# Patient Record
Sex: Female | Born: 1935 | Race: White | Hispanic: No | Marital: Married | Smoking: Never smoker
Health system: Southern US, Community
[De-identification: ages and names within clinical notes are randomized; demographics above are authoritative.]

## PROBLEM LIST (undated history)

## (undated) DIAGNOSIS — C801 Malignant (primary) neoplasm, unspecified: Secondary | ICD-10-CM

## (undated) DIAGNOSIS — I10 Essential (primary) hypertension: Secondary | ICD-10-CM

## (undated) DIAGNOSIS — J45909 Unspecified asthma, uncomplicated: Secondary | ICD-10-CM

## (undated) HISTORY — PX: MASTECTOMY: SHX3

## (undated) HISTORY — DX: Malignant (primary) neoplasm, unspecified: C80.1

## (undated) HISTORY — PX: APPENDECTOMY: SHX54

---

## 1998-05-06 ENCOUNTER — Other Ambulatory Visit: Admission: RE | Admit: 1998-05-06 | Discharge: 1998-05-06 | Payer: Self-pay | Admitting: Obstetrics and Gynecology

## 1998-11-02 ENCOUNTER — Ambulatory Visit (HOSPITAL_COMMUNITY): Admission: RE | Admit: 1998-11-02 | Discharge: 1998-11-02 | Payer: Self-pay | Admitting: *Deleted

## 1999-04-12 ENCOUNTER — Other Ambulatory Visit: Admission: RE | Admit: 1999-04-12 | Discharge: 1999-04-12 | Payer: Self-pay | Admitting: Obstetrics and Gynecology

## 1999-04-12 ENCOUNTER — Encounter (INDEPENDENT_AMBULATORY_CARE_PROVIDER_SITE_OTHER): Payer: Self-pay | Admitting: Specialist

## 1999-05-12 ENCOUNTER — Other Ambulatory Visit: Admission: RE | Admit: 1999-05-12 | Discharge: 1999-05-12 | Payer: Self-pay | Admitting: Obstetrics and Gynecology

## 1999-06-17 ENCOUNTER — Ambulatory Visit (HOSPITAL_COMMUNITY): Admission: RE | Admit: 1999-06-17 | Discharge: 1999-06-17 | Payer: Self-pay | Admitting: *Deleted

## 1999-07-02 ENCOUNTER — Ambulatory Visit (HOSPITAL_COMMUNITY): Admission: RE | Admit: 1999-07-02 | Discharge: 1999-07-02 | Payer: Self-pay | Admitting: Obstetrics and Gynecology

## 1999-07-02 ENCOUNTER — Encounter (INDEPENDENT_AMBULATORY_CARE_PROVIDER_SITE_OTHER): Payer: Self-pay

## 1999-10-29 ENCOUNTER — Ambulatory Visit (HOSPITAL_COMMUNITY): Admission: RE | Admit: 1999-10-29 | Discharge: 1999-10-29 | Payer: Self-pay | Admitting: *Deleted

## 1999-11-24 ENCOUNTER — Ambulatory Visit (HOSPITAL_COMMUNITY): Admission: RE | Admit: 1999-11-24 | Discharge: 1999-11-24 | Payer: Self-pay | Admitting: *Deleted

## 1999-12-29 ENCOUNTER — Ambulatory Visit (HOSPITAL_COMMUNITY): Admission: RE | Admit: 1999-12-29 | Discharge: 1999-12-29 | Payer: Self-pay | Admitting: *Deleted

## 2000-05-09 ENCOUNTER — Other Ambulatory Visit: Admission: RE | Admit: 2000-05-09 | Discharge: 2000-05-09 | Payer: Self-pay | Admitting: Obstetrics and Gynecology

## 2000-11-02 ENCOUNTER — Encounter: Payer: Self-pay | Admitting: Internal Medicine

## 2000-11-02 ENCOUNTER — Ambulatory Visit (HOSPITAL_COMMUNITY): Admission: RE | Admit: 2000-11-02 | Discharge: 2000-11-02 | Payer: Self-pay | Admitting: Internal Medicine

## 2000-11-20 ENCOUNTER — Ambulatory Visit (HOSPITAL_COMMUNITY): Admission: RE | Admit: 2000-11-20 | Discharge: 2000-11-20 | Payer: Self-pay | Admitting: Gastroenterology

## 2000-11-20 ENCOUNTER — Encounter (INDEPENDENT_AMBULATORY_CARE_PROVIDER_SITE_OTHER): Payer: Self-pay | Admitting: Specialist

## 2001-05-10 ENCOUNTER — Other Ambulatory Visit: Admission: RE | Admit: 2001-05-10 | Discharge: 2001-05-10 | Payer: Self-pay | Admitting: Obstetrics and Gynecology

## 2001-10-31 ENCOUNTER — Ambulatory Visit (HOSPITAL_COMMUNITY): Admission: RE | Admit: 2001-10-31 | Discharge: 2001-10-31 | Payer: Self-pay | Admitting: Internal Medicine

## 2001-10-31 ENCOUNTER — Encounter: Payer: Self-pay | Admitting: Internal Medicine

## 2002-05-13 ENCOUNTER — Other Ambulatory Visit: Admission: RE | Admit: 2002-05-13 | Discharge: 2002-05-13 | Payer: Self-pay | Admitting: Obstetrics and Gynecology

## 2002-06-27 ENCOUNTER — Encounter: Payer: Self-pay | Admitting: Emergency Medicine

## 2002-06-27 ENCOUNTER — Inpatient Hospital Stay (HOSPITAL_COMMUNITY): Admission: EM | Admit: 2002-06-27 | Discharge: 2002-06-29 | Payer: Self-pay | Admitting: Emergency Medicine

## 2002-06-27 ENCOUNTER — Encounter: Payer: Self-pay | Admitting: Internal Medicine

## 2002-06-28 ENCOUNTER — Encounter: Payer: Self-pay | Admitting: Internal Medicine

## 2002-12-09 ENCOUNTER — Ambulatory Visit (HOSPITAL_COMMUNITY): Admission: RE | Admit: 2002-12-09 | Discharge: 2002-12-09 | Payer: Self-pay | Admitting: Internal Medicine

## 2002-12-09 ENCOUNTER — Encounter: Payer: Self-pay | Admitting: Internal Medicine

## 2003-05-16 ENCOUNTER — Other Ambulatory Visit: Admission: RE | Admit: 2003-05-16 | Discharge: 2003-05-16 | Payer: Self-pay | Admitting: Obstetrics and Gynecology

## 2003-10-06 ENCOUNTER — Encounter: Admission: RE | Admit: 2003-10-06 | Discharge: 2003-10-06 | Payer: Self-pay | Admitting: Internal Medicine

## 2003-11-17 ENCOUNTER — Encounter: Admission: RE | Admit: 2003-11-17 | Discharge: 2003-11-17 | Payer: Self-pay | Admitting: Internal Medicine

## 2004-01-30 ENCOUNTER — Ambulatory Visit (HOSPITAL_COMMUNITY): Admission: RE | Admit: 2004-01-30 | Discharge: 2004-01-30 | Payer: Self-pay | Admitting: Gastroenterology

## 2004-05-17 ENCOUNTER — Other Ambulatory Visit: Admission: RE | Admit: 2004-05-17 | Discharge: 2004-05-17 | Payer: Self-pay | Admitting: Obstetrics and Gynecology

## 2004-12-15 ENCOUNTER — Ambulatory Visit: Payer: Self-pay | Admitting: Oncology

## 2005-02-01 ENCOUNTER — Ambulatory Visit: Payer: Self-pay | Admitting: Oncology

## 2005-05-17 ENCOUNTER — Other Ambulatory Visit: Admission: RE | Admit: 2005-05-17 | Discharge: 2005-05-17 | Payer: Self-pay | Admitting: Obstetrics and Gynecology

## 2005-10-19 ENCOUNTER — Encounter: Admission: RE | Admit: 2005-10-19 | Discharge: 2005-10-19 | Payer: Self-pay | Admitting: Internal Medicine

## 2006-04-19 ENCOUNTER — Encounter: Admission: RE | Admit: 2006-04-19 | Discharge: 2006-04-19 | Payer: Self-pay | Admitting: Internal Medicine

## 2006-05-19 ENCOUNTER — Other Ambulatory Visit: Admission: RE | Admit: 2006-05-19 | Discharge: 2006-05-19 | Payer: Self-pay | Admitting: Obstetrics and Gynecology

## 2007-05-08 ENCOUNTER — Encounter: Admission: RE | Admit: 2007-05-08 | Discharge: 2007-05-08 | Payer: Self-pay | Admitting: Internal Medicine

## 2007-05-22 ENCOUNTER — Other Ambulatory Visit: Admission: RE | Admit: 2007-05-22 | Discharge: 2007-05-22 | Payer: Self-pay | Admitting: Obstetrics and Gynecology

## 2008-06-05 ENCOUNTER — Other Ambulatory Visit: Admission: RE | Admit: 2008-06-05 | Discharge: 2008-06-05 | Payer: Self-pay | Admitting: Obstetrics and Gynecology

## 2011-01-14 NOTE — Procedures (Signed)
[place]. [facility]  Patient:    Michelle Franklin, Michelle Franklin                       MRN: [id number] Proc. Date: 11/20/00 Adm. Date:  [id number] Attending:  Rich Brave CC:         Marinus Maw, M.D.                           Procedure Report  PROCEDURE PERFORMED:  Colonoscopy with polypectomy.  ENDOSCOPIST:  Florencia Reasons, M.D.  INDICATIONS FOR PROCEDURE:  Screening for colon cancer in a 77 year old female.  FINDINGS:  Medium-sized sessile polyp at 60 cm removed by snare technique.  DESCRIPTION OF PROCEDURE:  The nature, purpose and risks of the procedure had been discussed with the patient, who provided written consent.  Sedation was fentanyl 70 mcg and Versed 7 mg IV without arrhythmias or desaturation.  The Olympus pediatric adjustable tension video colonoscope was used for this procedure but looped quite a bit, so the adult colonoscope might be better on future occasions.  With the patient in the supine position and some external abdominal compression and overriding of loops, I was able to reach the cecum and pullback was then performed.  The cecum was identified by clear visualization of the ileocecal valve and transillumination of the right lower quadrant region of the abdomen.  The quality of the prep was very good and it is felt that all areas were well seen.  The only abnormality on this exam was a medium-sized sessile polyp in two portions, in between a couple of folds at about 60 cm, felt to correspond to the distal transverse colon.  Longitudinally, the polyp probably measured about 1.5 cm to 2 cm and about 5 to 8 mm across, with a small 1 cm diameter hump adjacent to it.  These areas were elevated by injecting 1:10,000 epinephrine with a sclerotherapy needle beneath them, and then the snare was used to shave the tissue off, resulting in a good cautery artifact without any evidence of excessive cautery or any bleeding.  The  polyp fragments were retrieved for histologic analysis (two snares were performed).  These fragments were able to be suctioned through the scope.  No large polyps, cancer, colitis, vascular malformations or diverticulosis were observed and retroflexion in the rectum showed a hypertrophied anal papilla but was otherwise normal.  The patient tolerated the procedure well and there were no apparent complications.  IMPRESSION:  Colonic polyp removed as described above.  PLAN:  Await pathology with follow-up colonoscopy in three years if it is adenomatous in character, otherwise probably a flexible sigmoidoscopy in five years. DD:  11/20/00 TD:  11/21/00 Job: [id number]

## 2011-01-14 NOTE — Op Note (Signed)
NAME:  Michelle Franklin, Michelle Franklin                          ACCOUNT NO.:  [id number]   MEDICAL RECORD NO.:  [id number]                   PATIENT TYPE:  AMB   LOCATION:  ENDO                                 FACILITY:  [facility]   PHYSICIAN:  Bernette Redbird, M.D.                DATE OF BIRTH:  April 25, 1931   DATE OF PROCEDURE:  01/30/2004  DATE OF DISCHARGE:                                 OPERATIVE REPORT   PROCEDURE:  Colonoscopy.   INDICATIONS FOR PROCEDURE:  A 77 year old female for followup of colonic  adenoma removed three years ago.   FINDINGS:  Severe looping and tortuosity, but normal exam to the cecum.   DESCRIPTION OF PROCEDURE:  The nature, purpose and risk of the procedure  were familiar to the patient from prior examination and she provided written  consent. Sedation was fentanyl 70 mcg and Versed 8 mg IV without arrhythmias  or desaturation, administered during the course of this rather lengthy  procedure.   Based on previous experience with a lot of looping, I chose to initiate the  procedure with the Olympus adult video colonoscope.  However, I encountered  significant looping and then locking with this scope, even with the  patient in the supine and right lateral decubitus positions so withdrawal  was performed, looking as I came out, and retroflection was unremarkable.  I  then recolonoscoped the patient using the adjustable tension pediatric video  colonoscope. With the help of the patient in the supine position and then  the right lateral decubitus position, finally the left lateral decubitus  position and with two assistants pressing on the abdomen while in the supine  position, the scope was able to  be negotiated to the base of the cecum as  identified by clear visualization of the appendiceal orifice. Pullback was  then performed.  The quality of the prep was excellent and it is felt that  all areas were adequately seen.   This was a normal examination in terms of the absence  of any polyps, cancer,  colitis, vascular malformations or diverticulosis. The only abnormality was  the looping and tortuosity noted above.  No biopsies were obtained.  The  patient tolerated the procedure well and there were no apparent  complications.   IMPRESSION:  Prior history of colonic adenoma, with currently negative exam  (V12.72).   PLAN:  Followup colonoscopy in five years for ongoing surveillance.                                               Bernette Redbird, M.D.   RB/MEDQ  D:  01/30/2004  T:  01/30/2004  Job:  [id number]   cc:   Olene Craven, M.D.  [address]  [address]  [place]  [place] [address]  Fax: [phone]

## 2011-02-16 ENCOUNTER — Other Ambulatory Visit (HOSPITAL_COMMUNITY)
Admission: RE | Admit: 2011-02-16 | Discharge: 2011-02-16 | Disposition: A | Discharge status: Home / Self Care | Payer: BC Managed Care – PPO | Source: Ambulatory Visit | Attending: Obstetrics and Gynecology | Admitting: Obstetrics and Gynecology

## 2011-02-16 ENCOUNTER — Other Ambulatory Visit: Payer: Self-pay | Admitting: Obstetrics and Gynecology

## 2011-02-16 DIAGNOSIS — Z01419 Encounter for gynecological examination (general) (routine) without abnormal findings: Secondary | ICD-10-CM | POA: Insufficient documentation

## 2011-12-02 ENCOUNTER — Telehealth: Payer: Self-pay | Admitting: *Deleted

## 2011-12-02 NOTE — Telephone Encounter (Signed)
Patient requests records regarding her breast cancer diagnosis (type of breast cancer). Will have HIM send her path report and last office note and genetics clinic note.

## 2012-03-23 ENCOUNTER — Other Ambulatory Visit: Payer: Self-pay | Admitting: Internal Medicine

## 2012-03-23 DIAGNOSIS — R42 Dizziness and giddiness: Secondary | ICD-10-CM

## 2012-03-28 ENCOUNTER — Ambulatory Visit
Admission: RE | Admit: 2012-03-28 | Discharge: 2012-03-28 | Disposition: A | Discharge status: Home / Self Care | Payer: BC Managed Care – PPO | Source: Ambulatory Visit | Attending: Internal Medicine | Admitting: Internal Medicine

## 2012-03-28 DIAGNOSIS — R42 Dizziness and giddiness: Secondary | ICD-10-CM

## 2013-03-11 ENCOUNTER — Other Ambulatory Visit: Payer: Self-pay | Admitting: Internal Medicine

## 2013-03-11 DIAGNOSIS — R109 Unspecified abdominal pain: Secondary | ICD-10-CM

## 2013-03-12 ENCOUNTER — Ambulatory Visit
Admission: RE | Admit: 2013-03-12 | Discharge: 2013-03-12 | Disposition: A | Discharge status: Home / Self Care | Payer: Medicare PPO | Source: Ambulatory Visit | Attending: Internal Medicine | Admitting: Internal Medicine

## 2013-03-12 DIAGNOSIS — R109 Unspecified abdominal pain: Secondary | ICD-10-CM

## 2013-03-13 ENCOUNTER — Other Ambulatory Visit: Payer: Self-pay | Admitting: Internal Medicine

## 2013-03-13 DIAGNOSIS — R93429 Abnormal radiologic findings on diagnostic imaging of unspecified kidney: Secondary | ICD-10-CM

## 2013-03-13 DIAGNOSIS — N289 Disorder of kidney and ureter, unspecified: Secondary | ICD-10-CM

## 2013-03-15 ENCOUNTER — Inpatient Hospital Stay
Admission: RE | Admit: 2013-03-15 | Discharge: 2013-03-15 | Disposition: A | Discharge status: Home / Self Care | Payer: Medicare PPO | Source: Ambulatory Visit | Attending: Internal Medicine | Admitting: Internal Medicine

## 2013-03-20 ENCOUNTER — Other Ambulatory Visit: Payer: Medicare PPO

## 2013-03-30 ENCOUNTER — Ambulatory Visit
Admission: RE | Admit: 2013-03-30 | Discharge: 2013-03-30 | Disposition: A | Discharge status: Home / Self Care | Payer: Medicare PPO | Source: Ambulatory Visit | Attending: Internal Medicine | Admitting: Internal Medicine

## 2013-03-30 DIAGNOSIS — N289 Disorder of kidney and ureter, unspecified: Secondary | ICD-10-CM

## 2013-03-30 MED ORDER — GADOBENATE DIMEGLUMINE 529 MG/ML IV SOLN
14.0000 mL | Freq: Once | INTRAVENOUS | Status: AC | PRN
  Administered 2013-03-30: 14 mL via INTRAVENOUS

## 2013-06-01 ENCOUNTER — Emergency Department (HOSPITAL_COMMUNITY)
Admission: EM | Admit: 2013-06-01 | Discharge: 2013-06-01 | Disposition: A | Discharge status: Home / Self Care | Payer: Medicare PPO | Attending: Emergency Medicine | Admitting: Emergency Medicine

## 2013-06-01 ENCOUNTER — Emergency Department (HOSPITAL_COMMUNITY): Payer: Medicare PPO

## 2013-06-01 ENCOUNTER — Encounter (HOSPITAL_COMMUNITY): Payer: Self-pay | Admitting: *Deleted

## 2013-06-01 DIAGNOSIS — M1610 Unilateral primary osteoarthritis, unspecified hip: Secondary | ICD-10-CM | POA: Insufficient documentation

## 2013-06-01 DIAGNOSIS — J45909 Unspecified asthma, uncomplicated: Secondary | ICD-10-CM | POA: Insufficient documentation

## 2013-06-01 DIAGNOSIS — M1990 Unspecified osteoarthritis, unspecified site: Secondary | ICD-10-CM

## 2013-06-01 DIAGNOSIS — Z79899 Other long term (current) drug therapy: Secondary | ICD-10-CM | POA: Insufficient documentation

## 2013-06-01 DIAGNOSIS — M169 Osteoarthritis of hip, unspecified: Secondary | ICD-10-CM | POA: Insufficient documentation

## 2013-06-01 DIAGNOSIS — I10 Essential (primary) hypertension: Secondary | ICD-10-CM | POA: Insufficient documentation

## 2013-06-01 HISTORY — DX: Unspecified asthma, uncomplicated: J45.909

## 2013-06-01 HISTORY — DX: Essential (primary) hypertension: I10

## 2013-06-01 LAB — BASIC METABOLIC PANEL
BUN: 24 mg/dL — ABNORMAL HIGH (ref 6–23)
CO2: 26 mEq/L (ref 19–32)
Calcium: 9.6 mg/dL (ref 8.4–10.5)
Chloride: 102 mEq/L (ref 96–112)
Creatinine, Ser: 0.61 mg/dL (ref 0.50–1.10)

## 2013-06-01 LAB — CBC
HCT: 39 % (ref 36.0–46.0)
MCH: 29.6 pg (ref 26.0–34.0)
MCV: 90.3 fL (ref 78.0–100.0)
Platelets: 172 10*3/uL (ref 150–400)
RBC: 4.32 MIL/uL (ref 3.87–5.11)

## 2013-06-01 MED ORDER — HYDROCODONE-ACETAMINOPHEN 5-325 MG PO TABS: 1.0000 | ORAL_TABLET | Freq: Four times a day (QID) | ORAL | Status: AC | PRN

## 2013-06-01 MED ORDER — ONDANSETRON HCL 4 MG/2ML IJ SOLN
4.0000 mg | Freq: Once | INTRAMUSCULAR | Status: AC
  Administered 2013-06-01: 4 mg via INTRAVENOUS
  Filled 2013-06-01: qty 2

## 2013-06-01 MED ORDER — HYDROCODONE-ACETAMINOPHEN 5-325 MG PO TABS
1.0000 | ORAL_TABLET | Freq: Once | ORAL | Status: AC
  Administered 2013-06-01: 1 via ORAL
  Filled 2013-06-01: qty 1

## 2013-06-01 MED ORDER — ONDANSETRON HCL 4 MG/2ML IJ SOLN: 4.0000 mg | Freq: Once | INTRAMUSCULAR | Status: DC

## 2013-06-01 MED ORDER — DOCUSATE SODIUM 100 MG PO CAPS: 100.0000 mg | ORAL_CAPSULE | Freq: Two times a day (BID) | ORAL | Status: AC | PRN

## 2013-06-01 MED ORDER — FENTANYL CITRATE 0.05 MG/ML IJ SOLN: 50.0000 ug | INTRAMUSCULAR | Status: DC | PRN

## 2013-06-01 MED ORDER — FENTANYL CITRATE 0.05 MG/ML IJ SOLN
50.0000 ug | INTRAMUSCULAR | Status: DC | PRN
  Administered 2013-06-01 (×2): 50 ug via INTRAVENOUS
  Filled 2013-06-01 (×2): qty 2

## 2013-06-01 NOTE — ED Notes (Signed)
Patient states she has been up and down all night going back and forth to the bathroom while visiting with her husband. She went to sit down in the chair and she felt a sharp pain in her groin and hip. Patient states that she has not been able to get up since sitting down in the chair.

## 2013-06-01 NOTE — ED Provider Notes (Signed)
CSN: [id number]     Arrival date & time 06/01/13  0238 History   First MD Initiated Contact with Patient 06/01/13 0251     Chief Complaint  Patient presents with  . Hip Pain   (Consider location/radiation/quality/duration/timing/severity/associated sxs/prior Treatment) HPI History provided by patient. Her husband is admitted to the hospital, she has been staying with him, sitting in a recliner. Tonight she had been up and down going to the bathroom without issues, went to sit down in her recliner and developed left hip and groin pain, states it feels like a pulled muscle. Pain is severe and she is unable to ambulate. No fall or trauma. No back pain. No pain radiating down her leg. No weakness or numbness. No history of same.   Past Medical History  Diagnosis Date  . Asthma   . Hypertension    Past Surgical History  Procedure Laterality Date  . Appendectomy    . Mastectomy     No family history on file. History  Substance Use Topics  . Smoking status: Never Smoker   . Smokeless tobacco: Not on file  . Alcohol Use: No   OB History   Grav Para Term Preterm Abortions TAB SAB Ect Mult Living                 Review of Systems  Constitutional: Negative for fever and chills.  HENT: Negative for neck pain and neck stiffness.   Eyes: Negative for pain.  Respiratory: Negative for shortness of breath.   Cardiovascular: Negative for chest pain.  Gastrointestinal: Negative for abdominal pain.  Genitourinary: Negative for dysuria.  Musculoskeletal: Negative for back pain.  Skin: Negative for rash.  Neurological: Negative for headaches.  All other systems reviewed and are negative.    Allergies  Aspirin; Codeine; and Niacin and related  Home Medications   Current Outpatient Rx  Name  Route  Sig  Dispense  Refill  . calcium-vitamin D (OSCAL WITH D) 500-200 MG-UNIT per tablet   Oral   Take 1 tablet by mouth daily with breakfast.         . Fluticasone-Salmeterol (ADVAIR)  250-50 MCG/DOSE AEPB   Inhalation   Inhale 1 puff into the lungs every 12 (twelve) hours.         . prochlorperazine (COMPAZINE) 10 MG tablet   Oral   Take 10 mg by mouth every 6 (six) hours as needed (\).         . Sertraline HCl (ZOLOFT PO)   Oral   Take by mouth.         . triamcinolone (NASACORT) 55 MCG/ACT nasal inhaler   Nasal   Place 2 sprays into the nose daily.          BP 133/59  Pulse 55  Temp(Src) 98.2 F (36.8 C) (Oral)  Resp 20  SpO2 99% Physical Exam  Constitutional: She is oriented to person, place, and time. She appears well-developed and well-nourished.  HENT:  Head: Normocephalic and atraumatic.  Eyes: EOM are normal. Pupils are equal, round, and reactive to light.  Neck: Trachea normal. Neck supple.  Cardiovascular: Normal rate, regular rhythm and normal pulses.   Pulmonary/Chest: Effort normal and breath sounds normal. She has no rhonchi.  Abdominal: Soft. Normal appearance and bowel sounds are normal. There is no tenderness. There is no CVA tenderness and negative Murphy's sign.  Musculoskeletal:  Mild tenderness over the left hip without any lower extremity deformity and good range of motion at the  hip, knee. Distal neurovascular intact without any shortening or rotation. No midline thoracic or lumbar tenderness. No paralumbar or sciatic area tenderness.  Neurological: She is alert and oriented to person, place, and time. She has normal strength. No cranial nerve deficit or sensory deficit. GCS eye subscore is 4. GCS verbal subscore is 5. GCS motor subscore is 6.  Skin: Skin is warm and dry. She is not diaphoretic.  Psychiatric: Her speech is normal.  Cooperative and appropriate    ED Course  Procedures (including critical care time) Labs Review Labs Reviewed - No data to display Imaging Review Dg Hip Complete Left  06/01/2013   *RADIOLOGY REPORT*  Clinical Data: Left hip pain during pain.  LEFT HIP - COMPLETE 2+ VIEW  Comparison: CT of the  abdomen and pelvis March 12, 2013  Findings: No acute fracture deformity.  Femoral head is well formed and located.  Mild spurring of the femoral head.  Severe degenerative change of the pubic symphysis.  Levoscoliosis lumbar spine with degenerative change.  No destructive bony lesions.  Soft tissue planes are not suspicious.  IMPRESSION: Mild degenerative change of the left hip without fracture deformity nor dislocation.  Severe pubic symphysial osteoarthrosis.   Original Report Authenticated By: Awilda Metro   norco PO, ice  5:15 AM PT now ambulating and feeling much better, wants to rest to the emergency department.   6:50 AM patient again unable to ambulate with severe pain. MRI ordered. JK to follow MDM  Dx: L hip pain X-ray obtained and reviewed as above no apparent fracture Pain improved with sudden difficulty walking and bearing weight MRI ordered and pending   Sunnie Nielsen, MD 06/02/13 [phone]

## 2013-06-01 NOTE — ED Provider Notes (Signed)
MRI without acute fracture.  Pt feels ready for dc.  Will rx hydrocodone.  Her codeine "allergy" was she developed a cough when she was a child.  Celene Kras, MD 06/01/13 [phone]

## 2013-06-01 NOTE — ED Notes (Signed)
Pt back from MRI 

## 2013-06-01 NOTE — ED Notes (Signed)
Patient transported to MRI with Yehuda Mao, RN

## 2013-10-22 ENCOUNTER — Ambulatory Visit: Payer: Medicare PPO | Admitting: Neurology

## 2013-10-25 ENCOUNTER — Encounter: Payer: Self-pay | Admitting: Neurology

## 2013-10-25 ENCOUNTER — Telehealth: Payer: Self-pay | Admitting: Neurology

## 2013-10-25 NOTE — Telephone Encounter (Signed)
Pt no showed 10/22/13 appt w/ Dr. Tomi Likens. Most likely due to inclement weather. I tried to call the pt but she has a non working number. I will mail the patient a no show letter and fax a copy to Dr. Lina Sar office / Gayleen Orem.

## 2013-10-29 ENCOUNTER — Encounter: Payer: Self-pay | Admitting: Neurology

## 2013-10-29 ENCOUNTER — Ambulatory Visit (INDEPENDENT_AMBULATORY_CARE_PROVIDER_SITE_OTHER): Payer: Medicare PPO | Admitting: Neurology

## 2013-10-29 VITALS — BP 130/78 | HR 76 | Temp 98.0°F | Resp 18 | Ht 65.0 in | Wt 157.1 lb

## 2013-10-29 DIAGNOSIS — R413 Other amnesia: Secondary | ICD-10-CM

## 2013-10-29 NOTE — Patient Instructions (Signed)
I don't appreciate any real cognitive impairment on my testing.  A more specific and sensitive test would be for you to get a formal neuropsychological evaluation, which is more in depth and usually has to be performed over two separate days.  That would be appropriate for further evaluation for any underlying cognitive problems.  Otherwise, I would follow up in one year for re-evaluation (or as needed).  We will check B12 and thyroid as well.

## 2013-10-29 NOTE — Progress Notes (Signed)
NEUROLOGY CONSULTATION NOTE  Michelle Franklin MRN: [id number] DOB: [birth year]  Referring provider: Dr. Nehemiah SettlePolite Primary care provider: Dr. Nehemiah SettlePolite  Reason for consult:  Memory problems.  HISTORY OF PRESENT ILLNESS: Michelle Franklin is an 80 year old right-handed woman (originally left-handed) with history of depression who presents for memory issues.  Records and images were personally reviewed where available.    She is a retired [employer] teacher.  She says she was sent here by her PCP, Dr. Nehemiah SettlePolite at the request of her daughter.  Unfortunately, she is here alone and I do not have any of her PCP notes on the specifics.  She tells me that her daughter is concerned about her memory, but she doesn't feel she has any problems.  She lives alone in [place].  Her husband is under hospice care in a nursing home.  There is no other family in the area and her daughter lives in [place].  She manages all her finances.  She performs all her ADLs.  She is able to drive without difficulty or disorientation.  She denies misplacing objects.  She denies problems recognizing names or familiar faces.  She says that her daughter never really told her specifically her concerns but maybe it has to do with forgetting conversations here and there.  She may occasionally forget to take a medication, such as being in a rush in the morning, but typically she is compliant.  She denies depression and is stable on Zoloft.  She denies hallucinations or delusions.  She says that her paternal grandfather had dementia.  PAST MEDICAL HISTORY: Past Medical History  Diagnosis Date  . Asthma   . Hypertension   . Cancer     PAST SURGICAL HISTORY: Past Surgical History  Procedure Laterality Date  . Appendectomy    . Mastectomy      bi lat    MEDICATIONS: Current Outpatient Prescriptions on File Prior to Visit  Medication Sig Dispense Refill  . calcium-vitamin D (OSCAL WITH D) 500-200 MG-UNIT per tablet Take 1 tablet by mouth  daily with breakfast.      . docusate sodium (COLACE) 100 MG capsule Take 1 capsule (100 mg total) by mouth 2 (two) times daily as needed for constipation.  60 capsule  0  . Fluticasone-Salmeterol (ADVAIR) 100-50 MCG/DOSE AEPB Inhale 1 puff into the lungs every 12 (twelve) hours.      Marland Kitchen. HYDROcodone-acetaminophen (NORCO) 5-325 MG per tablet Take 1 tablet by mouth every 6 (six) hours as needed for pain.  16 tablet  0  . simvastatin (ZOCOR) 40 MG tablet Take 40 mg by mouth every evening.      . triamcinolone (NASACORT) 55 MCG/ACT nasal inhaler Place 2 sprays into the nose daily.       No current facility-administered medications on file prior to visit.    ALLERGIES: Allergies  Allergen Reactions  . Aspirin Hives  . Codeine Cough    Produces green mucous  . Niacin And Related     Doesn't remember reaction    FAMILY HISTORY: Family History  Problem Relation Age of Onset  . Cancer Mother   . Heart failure Father     SOCIAL HISTORY: History   Social History  . Marital Status: Married    Spouse Name: N/A    Number of Children: N/A  . Years of Education: N/A   Occupational History  . Not on file.   Social History Main Topics  . Smoking status: Never Smoker   .  Smokeless tobacco: Not on file  . Alcohol Use: No  . Drug Use: Not on file  . Sexual Activity: Not on file   Other Topics Concern  . Not on file   Social History Narrative  . No narrative on file    REVIEW OF SYSTEMS: Constitutional: No fevers, chills, or sweats, no generalized fatigue, change in appetite Eyes: No visual changes, double vision, eye pain Ear, nose and throat: No hearing loss, ear pain, nasal congestion, sore throat Cardiovascular: No chest pain, palpitations Respiratory:  No shortness of breath at rest or with exertion, wheezes GastrointestinaI: No nausea, vomiting, diarrhea, abdominal pain, fecal incontinence Genitourinary:  No dysuria, urinary retention or frequency Musculoskeletal:  No neck  pain, back pain Integumentary: No rash, pruritus, skin lesions Neurological: as above Psychiatric: No depression, insomnia, anxiety Endocrine: No palpitations, fatigue, diaphoresis, mood swings, change in appetite, change in weight, increased thirst Hematologic/Lymphatic:  No anemia, purpura, petechiae. Allergic/Immunologic: no itchy/runny eyes, nasal congestion, recent allergic reactions, rashes  PHYSICAL EXAM: Filed Vitals:   10/29/13 1505  BP: 130/78  Pulse: 76  Temp: 98 F (36.7 C)  Resp: 18   General: No acute distress Head:  Normocephalic/atraumatic Neck: supple, no paraspinal tenderness, full range of motion Back: No paraspinal tenderness Heart: regular rate and rhythm Lungs: Clear to auscultation bilaterally. Vascular: No carotid bruits. Neurological Exam: Mental status: alert and oriented to person, place, and time, speech fluent and not dysarthric.  Language intact:  Able to name and repeat with good naming fluency.  Visuospatial/executive functioning overall is intact:  Able to complete the Trail Making test and copy a cube.  With drawing the clock, she placed some of the numbers slightly outside the circle, but contour, sequence of numbers and hands were correct.  Attention was intact.  Abstraction intact.  Named 2 of 5 words with delayed recall.  MOCA 26/30. Cranial nerves: CN I: not tested CN II: pupils equal, round and reactive to light, visual fields intact, fundi unremarkable. CN III, IV, VI:  full range of motion, no nystagmus, no ptosis CN V: facial sensation intact CN VII: upper and lower face symmetric CN VIII: hearing intact CN IX, X: gag intact, uvula midline CN XI: sternocleidomastoid and trapezius muscles intact CN XII: tongue midline Bulk & Tone: normal, no fasciculations. Motor: 5/5 throughout Sensation: temperature and vibration intact. Deep Tendon Reflexes: 2+ throughout, toes down Finger to nose testing: Trace intention tremor bilaterally.  No  dysmetria. Gait: Normal stance and stride.  Able to turn and walk in tandem. Romberg negative.  IMPRESSION: Memory problems.  I was only able to use the history provided by the patient.  I don't really appreciate any mild cognitive impairment.  She overall scored well on the [facility] test.  She recalled 2 of 5 words, but otherwise didn't appear to exhibit any signs of mild cognitive impairment.  She got a point off for placing some of the numbers outside the circle, however the placement and sequence of numbers were overall correct.  PLAN: 1.  For more sensitive assessment, I suggested neuropsychological testing.  She says she wants to discuss it with her daughter first.  Otherwise, I would recommend follow up in one year (or as needed) for re-evaluation.  2.  We will check B12 and TSH.  45 minutes spent with patient, over 50% spent counseling and coordinating care.  Thank you for allowing me to take part in the care of this patient.  Metta Clines, DO  CC:  Jori Moll  Delfina Redwood, MD

## 2013-10-30 LAB — VITAMIN B12: Vitamin B-12: 550 pg/mL (ref 211–911)

## 2013-10-31 LAB — METHYLMALONIC ACID, SERUM: METHYLMALONIC ACID, QUANT: 0.17 umol/L (ref <0.40)

## 2013-11-01 ENCOUNTER — Telehealth: Payer: Self-pay | Admitting: *Deleted

## 2013-11-01 NOTE — Telephone Encounter (Signed)
Patient is aware that lab is normal

## 2013-11-20 ENCOUNTER — Other Ambulatory Visit: Payer: Self-pay | Admitting: *Deleted

## 2013-11-20 ENCOUNTER — Telehealth: Payer: Self-pay | Admitting: Neurology

## 2013-11-20 DIAGNOSIS — R413 Other amnesia: Secondary | ICD-10-CM

## 2013-11-20 NOTE — Telephone Encounter (Signed)
Please advise on this where you want her to go

## 2013-11-20 NOTE — Telephone Encounter (Signed)
She is going to [facility]  It has been added in [employer]

## 2013-11-20 NOTE — Telephone Encounter (Signed)
Pt called wanting to confirm that she wants to go further with the advanced memory test Dr. Tomi Likens has discussed with her. Please call pt.

## 2013-11-20 NOTE — Telephone Encounter (Signed)
Give her the options for [facility] or [facility].

## 2014-02-04 DIAGNOSIS — R413 Other amnesia: Secondary | ICD-10-CM

## 2014-02-21 DIAGNOSIS — R413 Other amnesia: Secondary | ICD-10-CM

## 2014-12-26 ENCOUNTER — Other Ambulatory Visit: Payer: Self-pay | Admitting: Gastroenterology

## 2015-02-03 DIAGNOSIS — T7840XD Allergy, unspecified, subsequent encounter: Secondary | ICD-10-CM | POA: Diagnosis not present

## 2015-02-03 DIAGNOSIS — Z87891 Personal history of nicotine dependence: Secondary | ICD-10-CM | POA: Diagnosis not present

## 2015-02-03 DIAGNOSIS — E785 Hyperlipidemia, unspecified: Secondary | ICD-10-CM | POA: Diagnosis not present

## 2015-02-03 DIAGNOSIS — Z6825 Body mass index (BMI) 25.0-25.9, adult: Secondary | ICD-10-CM | POA: Diagnosis not present

## 2015-02-03 DIAGNOSIS — M8588 Other specified disorders of bone density and structure, other site: Secondary | ICD-10-CM | POA: Diagnosis not present

## 2015-02-03 DIAGNOSIS — J45909 Unspecified asthma, uncomplicated: Secondary | ICD-10-CM | POA: Diagnosis not present

## 2015-02-03 DIAGNOSIS — E663 Overweight: Secondary | ICD-10-CM | POA: Diagnosis not present

## 2015-02-03 DIAGNOSIS — F329 Major depressive disorder, single episode, unspecified: Secondary | ICD-10-CM | POA: Diagnosis not present

## 2015-02-03 DIAGNOSIS — E780 Pure hypercholesterolemia: Secondary | ICD-10-CM | POA: Diagnosis not present

## 2015-02-03 DIAGNOSIS — Z86010 Personal history of colonic polyps: Secondary | ICD-10-CM | POA: Diagnosis not present

## 2015-02-03 DIAGNOSIS — I10 Essential (primary) hypertension: Secondary | ICD-10-CM | POA: Diagnosis not present

## 2015-02-04 DIAGNOSIS — N762 Acute vulvitis: Secondary | ICD-10-CM | POA: Diagnosis not present

## 2015-02-12 DIAGNOSIS — F329 Major depressive disorder, single episode, unspecified: Secondary | ICD-10-CM | POA: Diagnosis not present

## 2015-02-12 DIAGNOSIS — I10 Essential (primary) hypertension: Secondary | ICD-10-CM | POA: Diagnosis not present

## 2015-02-12 DIAGNOSIS — J301 Allergic rhinitis due to pollen: Secondary | ICD-10-CM | POA: Diagnosis not present

## 2015-02-12 DIAGNOSIS — I499 Cardiac arrhythmia, unspecified: Secondary | ICD-10-CM | POA: Diagnosis not present

## 2015-02-12 DIAGNOSIS — E780 Pure hypercholesterolemia: Secondary | ICD-10-CM | POA: Diagnosis not present

## 2015-02-12 DIAGNOSIS — J45909 Unspecified asthma, uncomplicated: Secondary | ICD-10-CM | POA: Diagnosis not present

## 2015-02-12 DIAGNOSIS — J3089 Other allergic rhinitis: Secondary | ICD-10-CM | POA: Diagnosis not present

## 2015-02-15 DIAGNOSIS — M542 Cervicalgia: Secondary | ICD-10-CM | POA: Diagnosis not present

## 2015-02-26 DIAGNOSIS — J069 Acute upper respiratory infection, unspecified: Secondary | ICD-10-CM | POA: Diagnosis not present

## 2015-03-13 ENCOUNTER — Ambulatory Visit: Payer: Medicare PPO | Admitting: Neurology

## 2015-04-03 DIAGNOSIS — J301 Allergic rhinitis due to pollen: Secondary | ICD-10-CM | POA: Diagnosis not present

## 2015-04-03 DIAGNOSIS — J3089 Other allergic rhinitis: Secondary | ICD-10-CM | POA: Diagnosis not present

## 2015-04-07 DIAGNOSIS — J301 Allergic rhinitis due to pollen: Secondary | ICD-10-CM | POA: Diagnosis not present

## 2015-04-09 ENCOUNTER — Encounter: Payer: Self-pay | Admitting: Neurology

## 2015-04-09 ENCOUNTER — Ambulatory Visit (INDEPENDENT_AMBULATORY_CARE_PROVIDER_SITE_OTHER): Payer: Medicare PPO | Admitting: Neurology

## 2015-04-09 ENCOUNTER — Ambulatory Visit: Payer: Medicare PPO | Admitting: Neurology

## 2015-04-09 VITALS — BP 126/68 | HR 40 | Resp 14 | Ht 65.0 in | Wt 151.3 lb

## 2015-04-09 DIAGNOSIS — R413 Other amnesia: Secondary | ICD-10-CM | POA: Diagnosis not present

## 2015-04-09 NOTE — Patient Instructions (Signed)
You do not have Alzheimer's disease.  However, since you were unable to initially recall any of the words on testing, I cannot say for sure that you do not have mild cognitive impairment affecting only memory.  Since you have not had any noticeable decline over the past year, I suspect you don't have any cognitive impairment.  However, I would like to get you in to see Dr. Valentina Shaggy again for another neurocognitive exam to be done prior to moving.

## 2015-04-09 NOTE — Addendum Note (Signed)
Addended by: Charyl Bigger E on: 04/09/2015 12:27 PM   Modules accepted: Orders
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
## 2015-04-14 DIAGNOSIS — J3089 Other allergic rhinitis: Secondary | ICD-10-CM | POA: Diagnosis not present

## 2015-04-14 DIAGNOSIS — J301 Allergic rhinitis due to pollen: Secondary | ICD-10-CM | POA: Diagnosis not present

## 2015-04-17 DIAGNOSIS — J301 Allergic rhinitis due to pollen: Secondary | ICD-10-CM | POA: Diagnosis not present

## 2015-04-17 DIAGNOSIS — J3089 Other allergic rhinitis: Secondary | ICD-10-CM | POA: Diagnosis not present

## 2015-04-21 DIAGNOSIS — D2311 Other benign neoplasm of skin of right eyelid, including canthus: Secondary | ICD-10-CM | POA: Diagnosis not present

## 2015-05-11 IMAGING — CR DG HIP (WITH OR WITHOUT PELVIS) 2-3V*L*
3 series · 3 of 3 positions shown · non-contrast
Comparison: CT of the abdomen and pelvis March 12, 2013

CLINICAL DATA: Left hip pain during pain.

LEFT HIP - COMPLETE 2+ VIEW

[t pelvis ap]
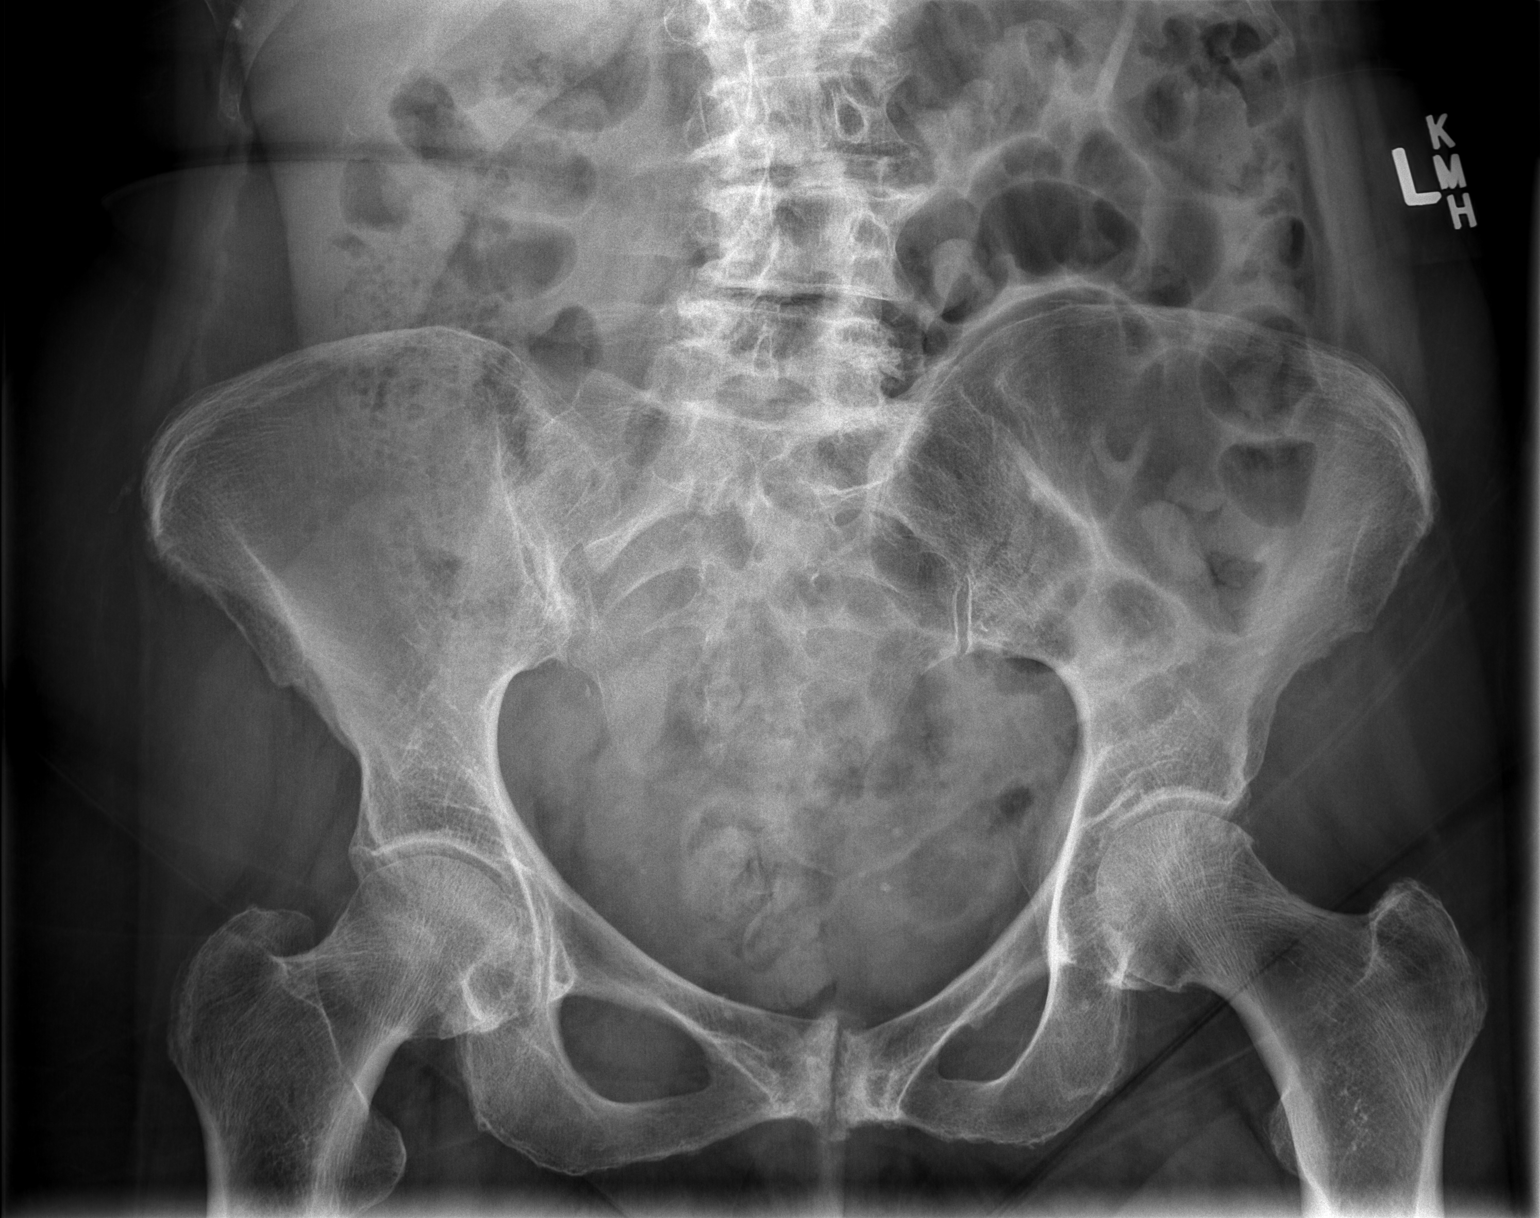

[t hip ap left]
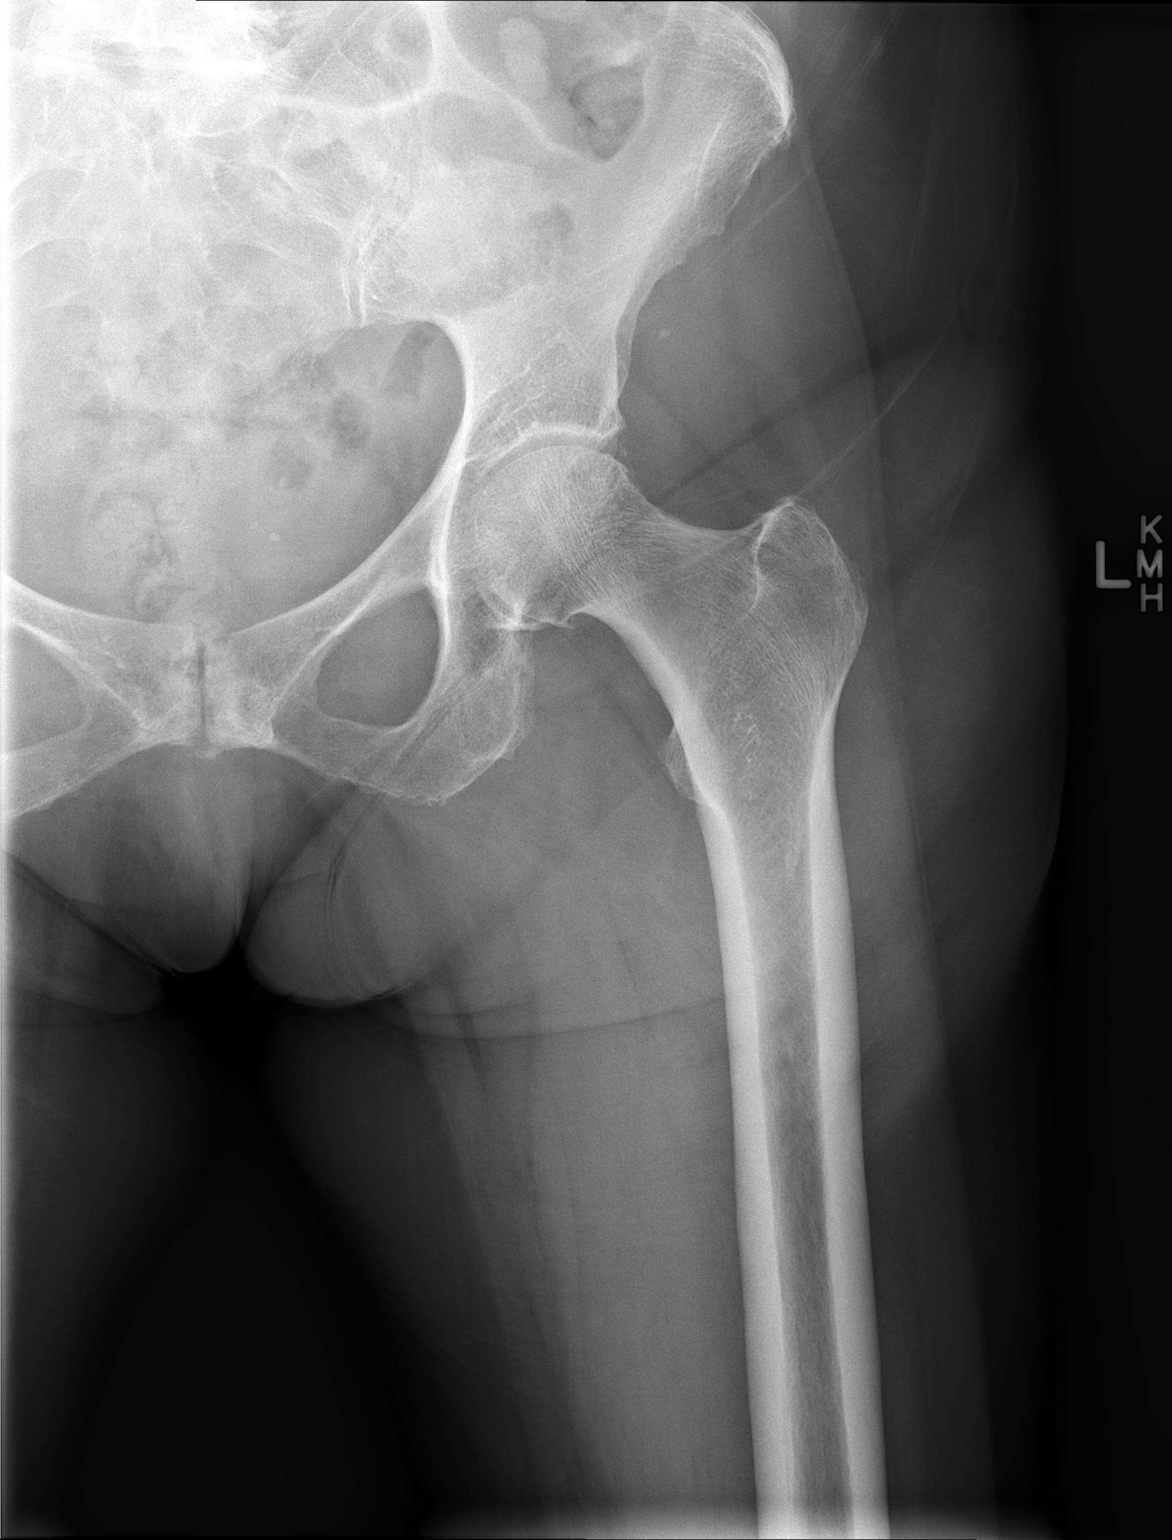

[t hip frog leg left]
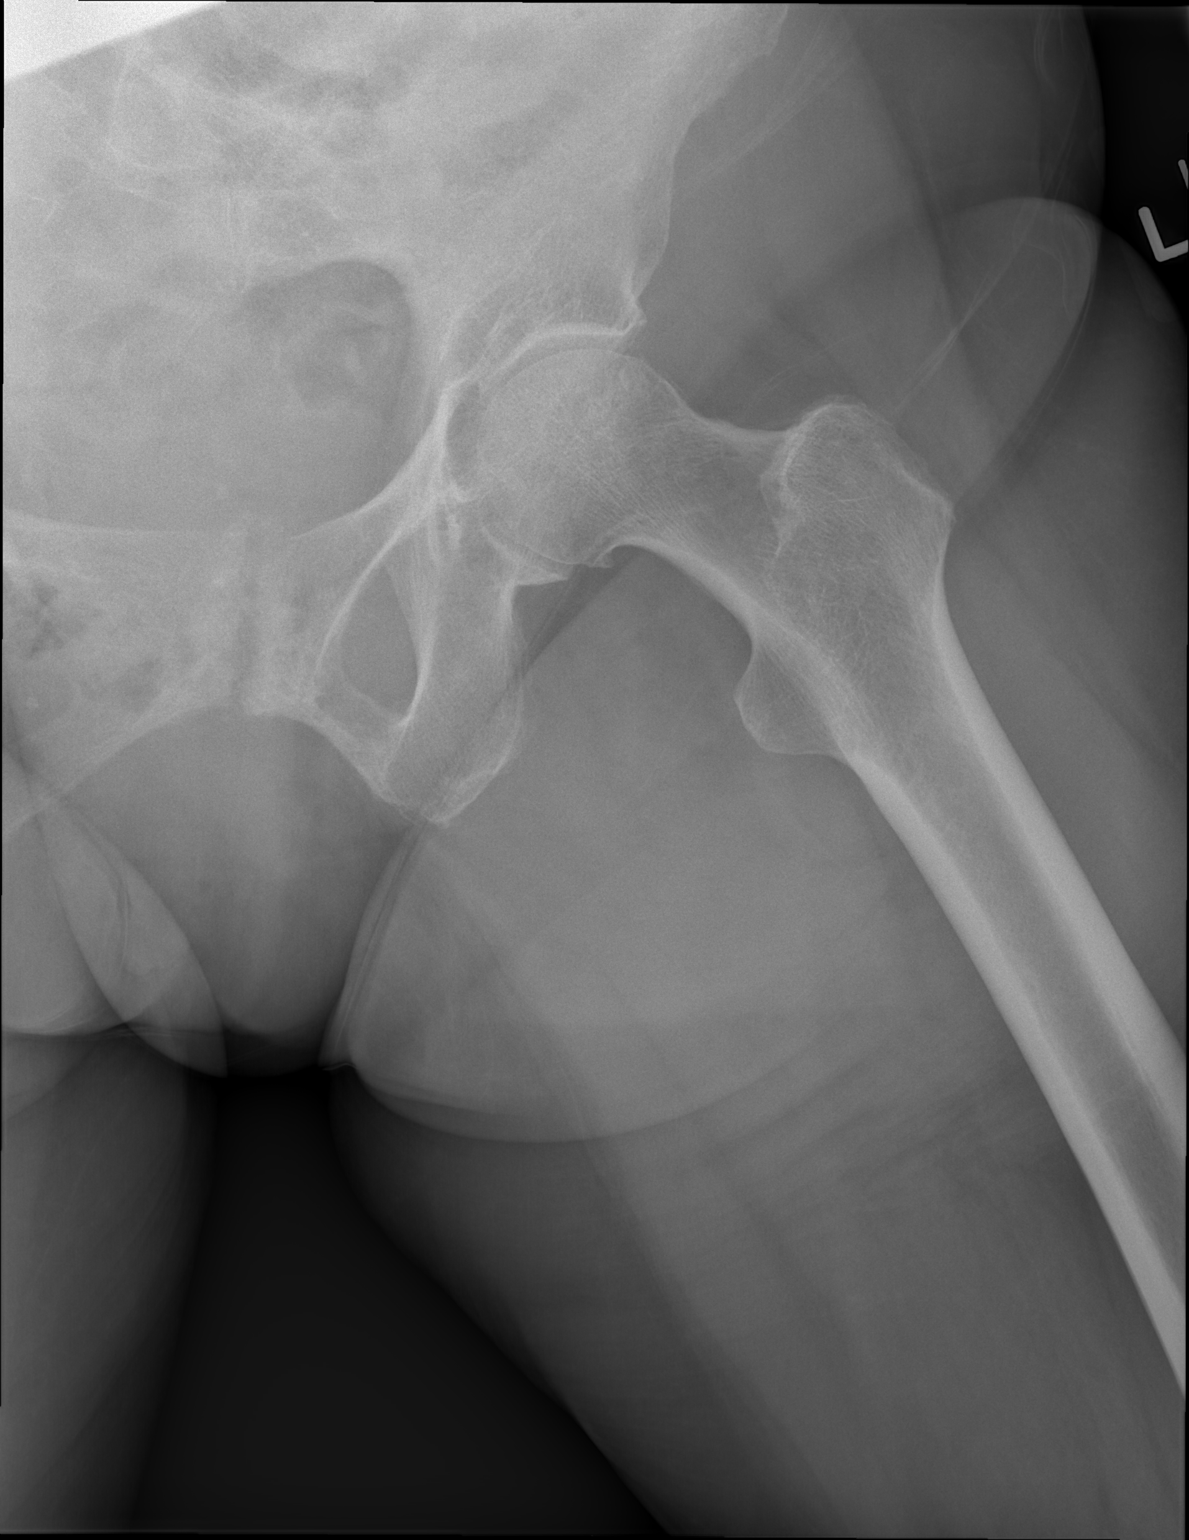

[3 of 3 positions shown; findings below may reference images not displayed]

FINDINGS: No acute fracture deformity.  Femoral head is well formed
and located.  Mild spurring of the femoral head.  Severe
degenerative change of the pubic symphysis.  Levoscoliosis lumbar
spine with degenerative change.  No destructive bony lesions.  Soft
tissue planes are not suspicious.
IMPRESSION: Mild degenerative change of the left hip without fracture deformity
nor dislocation.

Severe pubic symphysial osteoarthrosis.

## 2015-07-16 DIAGNOSIS — I10 Essential (primary) hypertension: Secondary | ICD-10-CM | POA: Diagnosis not present

## 2015-07-16 DIAGNOSIS — E7800 Pure hypercholesterolemia, unspecified: Secondary | ICD-10-CM | POA: Diagnosis not present

## 2015-07-16 DIAGNOSIS — C50919 Malignant neoplasm of unspecified site of unspecified female breast: Secondary | ICD-10-CM | POA: Diagnosis not present

## 2015-07-16 DIAGNOSIS — M8580 Other specified disorders of bone density and structure, unspecified site: Secondary | ICD-10-CM | POA: Diagnosis not present

## 2015-07-16 DIAGNOSIS — F329 Major depressive disorder, single episode, unspecified: Secondary | ICD-10-CM | POA: Diagnosis not present

## 2015-07-16 DIAGNOSIS — J45909 Unspecified asthma, uncomplicated: Secondary | ICD-10-CM | POA: Diagnosis not present

## 2015-07-16 DIAGNOSIS — R4189 Other symptoms and signs involving cognitive functions and awareness: Secondary | ICD-10-CM | POA: Diagnosis not present

## 2015-07-16 DIAGNOSIS — K635 Polyp of colon: Secondary | ICD-10-CM | POA: Diagnosis not present

## 2017-11-07 ENCOUNTER — Encounter: Payer: Self-pay | Admitting: Genetics

## 2020-04-10 ENCOUNTER — Encounter: Payer: Self-pay | Admitting: Genetic Counselor
